# Patient Record
Sex: Female | Born: 1995 | Race: White | Hispanic: No | Marital: Single | Smoking: Never smoker
Health system: Southern US, Community
[De-identification: ages and names within clinical notes are randomized; demographics above are authoritative.]

## PROBLEM LIST (undated history)

## (undated) DIAGNOSIS — R1013 Epigastric pain: Secondary | ICD-10-CM

## (undated) DIAGNOSIS — IMO0002 Reserved for concepts with insufficient information to code with codable children: Secondary | ICD-10-CM

## (undated) DIAGNOSIS — R011 Cardiac murmur, unspecified: Secondary | ICD-10-CM

## (undated) HISTORY — DX: Epigastric pain: R10.13

## (undated) HISTORY — DX: Cardiac murmur, unspecified: R01.1

## (undated) HISTORY — DX: Reserved for concepts with insufficient information to code with codable children: IMO0002

## (undated) HISTORY — PX: TONSILLECTOMY: SUR1361

---
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
## 2013-04-14 ENCOUNTER — Telehealth: Payer: Self-pay | Admitting: Internal Medicine

## 2013-04-14 NOTE — Telephone Encounter (Signed)
Pt aware.

## 2013-04-14 NOTE — Telephone Encounter (Signed)
Pt scheduled for 11:15 on 04/22/13. Please notify pt of this appointment.

## 2013-04-14 NOTE — Telephone Encounter (Signed)
Pt has 2  knots on the back of neck. Pt necks also "pops" when she turns her head, but the biggest thing is the pain in her neck. Pain all the time. Pt having migraines w/ her current BC pills. Pt would prefer to wait for dr Fabian Sharp. Pt home from school for Christmas, ok to  schedule?

## 2013-04-22 ENCOUNTER — Encounter: Payer: Self-pay | Admitting: Internal Medicine

## 2013-04-22 ENCOUNTER — Ambulatory Visit (INDEPENDENT_AMBULATORY_CARE_PROVIDER_SITE_OTHER): Payer: Managed Care, Other (non HMO) | Admitting: Internal Medicine

## 2013-04-22 ENCOUNTER — Ambulatory Visit (INDEPENDENT_AMBULATORY_CARE_PROVIDER_SITE_OTHER)
Admission: RE | Admit: 2013-04-22 | Discharge: 2013-04-22 | Disposition: A | Discharge status: Home / Self Care | Payer: Managed Care, Other (non HMO) | Source: Ambulatory Visit | Attending: Internal Medicine | Admitting: Internal Medicine

## 2013-04-22 VITALS — BP 102/70 | HR 63 | Temp 98.2°F | Wt 128.0 lb

## 2013-04-22 DIAGNOSIS — M62838 Other muscle spasm: Secondary | ICD-10-CM

## 2013-04-22 DIAGNOSIS — Z79899 Other long term (current) drug therapy: Secondary | ICD-10-CM

## 2013-04-22 DIAGNOSIS — Z23 Encounter for immunization: Secondary | ICD-10-CM

## 2013-04-22 DIAGNOSIS — Z3041 Encounter for surveillance of contraceptive pills: Secondary | ICD-10-CM

## 2013-04-22 DIAGNOSIS — G43829 Menstrual migraine, not intractable, without status migrainosus: Secondary | ICD-10-CM

## 2013-04-22 DIAGNOSIS — M542 Cervicalgia: Secondary | ICD-10-CM

## 2013-04-22 MED ORDER — NORETHIN ACE-ETH ESTRAD-FE 1-20 MG-MCG PO TABS: 1.0000 | ORAL_TABLET | Freq: Every day | ORAL | Status: DC

## 2013-04-22 NOTE — Progress Notes (Signed)
Chief Complaint  Patient presents with  . Neck Pain    The two do not seem to be related.    . Migraine    HPI: Patient comes in today for SDA for   problem evaluation. Mom here for part of visit .  Fu has and ocps : Migraines: from  OCP getting worse   Last week.  Had vomiting with this Ha during "placebo week"  Bad head pains and nausea and eyes hurt.  Has bee on ocps for 3 years but only this year a problem   Happens every month s.most recently.   No [place] vision changes  Sleep 8 hours .  No skipped meals .  caffiene some  And having hair up.  [employer] psych and spanish.  Check  Know on neck about  2 months.  Notices when has neck popping. No history of trauma neurologic signs weakness numbness loss of consciousness. ROS: See pertinent positives and negatives per HPI. No cp sob depression syncope .   Past Medical History  Diagnosis Date  . Heart murmur   . Abdominal pain, epigastric 12/07/2009    Qualifier: Diagnosis of  By: Abner Greenspan MD, Misty Stanley    . Other injury of other sites of trunk 08/26/2007    Qualifier: Diagnosis of  By: Fabian Sharp MD, Neta Mends     Family History  Problem Relation Age of Onset  . Thyroid disease Mother   . Diabetes Father   . Breast cancer      maternal aunt    History   Social History  . Marital Status: Single    Spouse Name: N/A    Number of Children: N/A  . Years of Education: N/A   Social History Main Topics  . Smoking status: Never Smoker   . Smokeless tobacco: Never Used  . Alcohol Use: No  . Drug Use: No  . Sexual Activity: None   Other Topics Concern  . None   Social History Narrative   No easy bleeding   Active in Sports    high school student parents divorced      [employer]  Criminology  [employer]    Outpatient Encounter Prescriptions as of 04/22/2013  Medication Sig  . mupirocin cream (BACTROBAN) 2 % Apply topically 3 (three) times daily.  . norethindrone-ethinyl estradiol (JUNEL FE,GILDESS FE,LOESTRIN FE) 1-20  MG-MCG tablet Take 1 tablet by mouth daily. Take continuously for 3 packs.  . triamcinolone cream (KENALOG) 0.1 % Apply topically 2 (two) times daily.  . [DISCONTINUED] norethindrone-ethinyl estradiol (JUNEL FE,GILDESS FE,LOESTRIN FE) 1-20 MG-MCG tablet Take 1 tablet by mouth daily.    EXAM:  BP 102/70  Pulse 63  Temp(Src) 98.2 F (36.8 C) (Oral)  Wt 128 lb (58.06 kg)  SpO2 98%  LMP 04/17/2013  Body mass index is 20.82 kg/(m^2).  GENERAL: vitals reviewed and listed above, alert, oriented, appears well hydrated and in no acute distress HEENT: atraumatic, conjunctiva  clear, no obvious abnormalities on inspection of external nose and ears OP : no lesion edema or exudate  NECK:  Tight ? hypertrophy area right paracervical area  No lump or obv adenopathy.  LUNGS: clear to auscultation bilaterally, no wheezes, rales or rhonchi, good air movement CV: HRRR, no clubbing cyanosis or  peripheral edema nl cap refill  Abdomen:  Sof,t normal bowel sounds without hepatosplenomegaly, no guarding rebound or masses no CVA tenderness MS: moves all extremities without noticeable focal  abnormality Neuro grossly intact  Skin: normal capillary refill ,  turgor , color: No acute rashes ,petechiae or bruising PSYCH: pleasant and cooperative, no obvious depression or anxiety  ASSESSMENT AND PLAN:  Discussed the following assessment and plan:  Menstrual migraine - timing and context  try continuous therapy monitor has rov 6 months touch base 3 months  Neck muscle spasm ? - exercises given consider pt recheck if problamtatic - Plan: DG Cervical Spine Complete  Oral contraceptive use  Neck pain on right side - Plan: DG Cervical Spine Complete  Medication management  Need for HPV vaccination - Plan: HPV vaccine quadravalent 3 dose IM Dont  feel a mass in the neck however she has a very tight thickened paracervical area on the right. Exercises consider pt etc  -Patient advised to return or notify health  care team  if symptoms worsen or persist or new concerns arise.  Patient Instructions  Get neck x ray . This could be spasm . consider physical therapy sports medicine .   Have [place] or other recheck when lump is there.   Try continuous therapy for ocps and monitor HAs.  Contact us in 3 monts by my chart about how this is working . ROV preventive visit in 6 months or as needed.      Neta Mends. Panosh M.D.  Pre visit review using our clinic review tool, if applicable. No additional management support is needed unless otherwise documented below in the visit note.

## 2013-04-22 NOTE — Patient Instructions (Signed)
Get neck x ray . This could be spasm . consider physical therapy sports medicine .   Have [place] or other recheck when lump is there.   Try continuous therapy for ocps and monitor HAs.  Contact us in 3 monts by my chart about how this is working . ROV preventive visit in 6 months or as needed.

## 2013-05-04 ENCOUNTER — Encounter: Payer: Self-pay | Admitting: Internal Medicine

## 2013-06-09 ENCOUNTER — Encounter: Payer: Self-pay | Admitting: Internal Medicine

## 2013-06-09 NOTE — Telephone Encounter (Signed)
prob not pregnancy is taking on time and no missed pills.  Can do OTC pregnancy test .  And repeat in 1 week  To be sure.  Contact us if problems continue

## 2013-06-21 ENCOUNTER — Other Ambulatory Visit: Payer: Self-pay | Admitting: Internal Medicine

## 2013-06-23 ENCOUNTER — Telehealth: Payer: Self-pay | Admitting: Internal Medicine

## 2013-06-23 ENCOUNTER — Encounter: Payer: Self-pay | Admitting: Internal Medicine

## 2013-06-23 MED ORDER — NORETHIN ACE-ETH ESTRAD-FE 1-20 MG-MCG PO TABS: 1.0000 | ORAL_TABLET | Freq: Every day | ORAL | Status: DC

## 2013-06-23 NOTE — Telephone Encounter (Signed)
Pt needs re-fill of norethindrone-ethinyl estradiol (JUNEL FE,GILDESS FE,LOESTRIN FE) 1-20 MG-MCG tablet, sent to [facility] on [address]. Pt was advised the RX was expired.

## 2013-06-23 NOTE — Telephone Encounter (Signed)
Pt needs re-fill of norethindrone-ethinyl estradiol (JUNEL FE,GILDESS FE,LOESTRIN FE) 1-20 MG-MCG tablet [facility] to [facility] [address]. Pt was advised the RX was expired.

## 2013-06-23 NOTE — Telephone Encounter (Signed)
Sent to the pharmacy by e-scribe. 

## 2013-07-07 ENCOUNTER — Encounter (HOSPITAL_BASED_OUTPATIENT_CLINIC_OR_DEPARTMENT_OTHER): Payer: Self-pay | Admitting: Emergency Medicine

## 2013-07-07 ENCOUNTER — Emergency Department (HOSPITAL_BASED_OUTPATIENT_CLINIC_OR_DEPARTMENT_OTHER)
Admission: EM | Admit: 2013-07-07 | Discharge: 2013-07-07 | Disposition: A | Discharge status: Home / Self Care | Payer: Managed Care, Other (non HMO) | Attending: Emergency Medicine | Admitting: Emergency Medicine

## 2013-07-07 ENCOUNTER — Emergency Department (HOSPITAL_BASED_OUTPATIENT_CLINIC_OR_DEPARTMENT_OTHER): Payer: Managed Care, Other (non HMO)

## 2013-07-07 DIAGNOSIS — Z792 Long term (current) use of antibiotics: Secondary | ICD-10-CM | POA: Insufficient documentation

## 2013-07-07 DIAGNOSIS — Y929 Unspecified place or not applicable: Secondary | ICD-10-CM | POA: Insufficient documentation

## 2013-07-07 DIAGNOSIS — Y9389 Activity, other specified: Secondary | ICD-10-CM | POA: Insufficient documentation

## 2013-07-07 DIAGNOSIS — S022XXA Fracture of nasal bones, initial encounter for closed fracture: Secondary | ICD-10-CM

## 2013-07-07 DIAGNOSIS — IMO0002 Reserved for concepts with insufficient information to code with codable children: Secondary | ICD-10-CM | POA: Insufficient documentation

## 2013-07-07 DIAGNOSIS — Z8719 Personal history of other diseases of the digestive system: Secondary | ICD-10-CM | POA: Insufficient documentation

## 2013-07-07 DIAGNOSIS — Z79899 Other long term (current) drug therapy: Secondary | ICD-10-CM | POA: Insufficient documentation

## 2013-07-07 DIAGNOSIS — R011 Cardiac murmur, unspecified: Secondary | ICD-10-CM | POA: Insufficient documentation

## 2013-07-07 DIAGNOSIS — W64XXXA Exposure to other animate mechanical forces, initial encounter: Secondary | ICD-10-CM | POA: Insufficient documentation

## 2013-07-07 MED ORDER — HYDROCODONE-ACETAMINOPHEN 5-325 MG PO TABS: 1.0000 | ORAL_TABLET | ORAL | Status: DC | PRN

## 2013-07-07 MED ORDER — HYDROCODONE-ACETAMINOPHEN 5-325 MG PO TABS
1.0000 | ORAL_TABLET | Freq: Once | ORAL | Status: AC
  Administered 2013-07-07: 1 via ORAL
  Filled 2013-07-07: qty 1

## 2013-07-07 NOTE — ED Notes (Signed)
Patient transported to X-ray 

## 2013-07-07 NOTE — ED Provider Notes (Signed)
CSN: [id number]     Arrival date & time 07/07/13  1551 History   First MD Initiated Contact with Patient 07/07/13 1606     Chief Complaint  Patient presents with  . Facial Injury     (Consider location/radiation/quality/duration/timing/severity/associated sxs/prior Treatment) Patient is a 18 y.o. female presenting with facial injury. The history is provided by the patient.  Facial Injury Mechanism of injury:  Direct blow Location:  Nose Time since incident:  3 hours Pain details:    Quality:  Aching and throbbing   Severity:  Moderate   Timing:  Constant   Progression:  Worsening Chronicity:  New Foreign body present:  No foreign bodies Relieved by:  Nothing Ineffective treatments:  Ice pack Associated symptoms: no double vision, no ear pain, no epistaxis, no headaches, no loss of consciousness, no nausea, no neck pain and no vomiting   Joanne Tucker is a 18 y.o. female who presents to the ED with nasal pain. She had bent forward and her dog jumped up at the same time and his head hit her in her nose. She complains of pain and swelling since the injury. She felt a pop at the time of the injury. She applied ice immediately after the injury. Denies LOC, nose bleeding or any other injuries.   Past Medical History  Diagnosis Date  . Heart murmur   . Abdominal pain, epigastric 12/07/2009    Qualifier: Diagnosis of  By: Abner GreenspanBlyth MD, Misty StanleyStacey    . Other injury of other sites of trunk 08/26/2007    Qualifier: Diagnosis of  By: Fabian SharpPanosh MD, Neta MendsWanda K    Past Surgical History  Procedure Laterality Date  . Tonsillectomy     Family History  Problem Relation Age of Onset  . Thyroid disease Mother   . Diabetes Father   . Breast cancer      maternal aunt   History  Substance Use Topics  . Smoking status: Never Smoker   . Smokeless tobacco: Never Used  . Alcohol Use: No   OB History   Grav Para Term Preterm Abortions TAB SAB Ect Mult Living                 Review of Systems   Constitutional: Negative for fever.  HENT: Negative for ear pain, nosebleeds and sore throat.   Eyes: Negative for double vision and visual disturbance.  Respiratory: Negative for shortness of breath.   Cardiovascular: Negative for chest pain.  Gastrointestinal: Negative for nausea, vomiting and abdominal pain.  Musculoskeletal: Negative for neck pain.  Skin: Negative for wound.  Neurological: Negative for loss of consciousness and headaches.  Psychiatric/Behavioral: Negative for confusion.      Allergies  Review of patient's allergies indicates no known allergies.  Home Medications   Current Outpatient Rx  Name  Route  Sig  Dispense  Refill  . mupirocin cream (BACTROBAN) 2 %   Topical   Apply topically 3 (three) times daily.   15 g   0   . norethindrone-ethinyl estradiol (JUNEL FE,GILDESS FE,LOESTRIN FE) 1-20 MG-MCG tablet   Oral   Take 1 tablet by mouth daily. Take continuously for 3 packs.   3 Package   1     PHARMACY:  PLEASE FILL FOR 6 MONTHS   . triamcinolone cream (KENALOG) 0.1 %   Topical   Apply topically 2 (two) times daily.   30 g   0    BP 108/64  Pulse 62  Temp(Src) 98.2 F (36.8  C) (Oral)  Resp 16  Ht 5\' 7"  (1.702 m)  Wt 125 lb (56.7 kg)  BMI 19.57 kg/m2  SpO2 99% Physical Exam  Nursing note and vitals reviewed. Constitutional: She is oriented to person, place, and time. She appears well-developed and well-nourished. No distress.  HENT:  Head: Normocephalic.  Right Ear: Tympanic membrane normal.  Left Ear: Tympanic membrane normal.  Nose: Mucosal edema and sinus tenderness present. No nasal septal hematoma. No epistaxis.  Mouth/Throat: Uvula is midline, oropharynx is clear and moist and mucous membranes are normal.  Eyes: Conjunctivae and EOM are normal.  Neck: Neck supple.  Cardiovascular: Normal rate.   Pulmonary/Chest: Effort normal.  Abdominal: Soft. There is no tenderness.  Musculoskeletal: Normal range of motion.  Neurological:  She is alert and oriented to person, place, and time. No cranial nerve deficit.  Skin: Skin is warm and dry.  Psychiatric: She has a normal mood and affect. Her behavior is normal.   Dg Nasal Bones  07/07/2013   CLINICAL DATA:  Trauma with pain and swelling  EXAM: NASAL BONES - 3+ VIEW  COMPARISON:  None.  FINDINGS: There are minimal fractures of the nasal bones, not apparently displaced. No fluid in the sinuses.  IMPRESSION: Nondisplaced nasal bone fractures.   Electronically Signed   By: Paulina Fusi M.D.   On: 07/07/2013 16:43    ED Course  Procedures   MDM  18 y.o. female with nasal pain and swelling s/p injury. Will treat with pain medication and she will take Advil. She will follow up with ENT for the nasal fracture. She will continue to apply ice. She will return for any problems.  I have reviewed this patient's vital signs, nurses notes, appropriate imaging and discussed findings with the patient and her mother. They voice understanding.    Medication List    TAKE these medications       HYDROcodone-acetaminophen 5-325 MG per tablet  Commonly known as:  NORCO/VICODIN  Take 1 tablet by mouth every 4 (four) hours as needed.      ASK your doctor about these medications       mupirocin cream 2 %  Commonly known as:  BACTROBAN  Apply topically 3 (three) times daily.     norethindrone-ethinyl estradiol 1-20 MG-MCG tablet  Commonly known as:  JUNEL FE,GILDESS FE,LOESTRIN FE  Take 1 tablet by mouth daily. Take continuously for 3 packs.     triamcinolone cream 0.1 %  Commonly known as:  KENALOG  Apply topically 2 (two) times daily.            [place], [place] 07/07/13 [phone]

## 2013-07-07 NOTE — ED Notes (Signed)
Pt reports that her dog jumped up and hit her in the nose.  Denies bleed.  Noted to have a swollen, slight reddened nose.

## 2013-07-07 NOTE — Discharge Instructions (Signed)
Your x-ray today shows that you have a fracture of the nasal bone. You will need to follow up with ENT. Call Dr. Raye SorrowWolicki's office to schedule an appointment. You can use Afrin Nasal Spray to help decrease the swelling but only use twice a day for 3 days. Continue to apply ice to the area. Take ibuprofen in addition to the narcotic pain medication. Do not take the narcotic if you are driving as it will make you sleepy. Return for any problems.

## 2013-07-07 NOTE — ED Provider Notes (Signed)
Medical screening examination/treatment/procedure(s) were performed by non-physician practitioner and as supervising physician I was immediately available for consultation/collaboration.   EKG Interpretation None        Layla MawKristen N Ward, DO 07/07/13 2301

## 2013-09-16 ENCOUNTER — Encounter: Payer: Self-pay | Admitting: Internal Medicine

## 2013-09-16 ENCOUNTER — Ambulatory Visit (INDEPENDENT_AMBULATORY_CARE_PROVIDER_SITE_OTHER): Payer: Managed Care, Other (non HMO) | Admitting: Internal Medicine

## 2013-09-16 VITALS — BP 110/76 | Temp 98.5°F | Ht 65.75 in | Wt 131.0 lb

## 2013-09-16 DIAGNOSIS — Z299 Encounter for prophylactic measures, unspecified: Secondary | ICD-10-CM

## 2013-09-16 DIAGNOSIS — Z79899 Other long term (current) drug therapy: Secondary | ICD-10-CM

## 2013-09-16 DIAGNOSIS — G43829 Menstrual migraine, not intractable, without status migrainosus: Secondary | ICD-10-CM

## 2013-09-16 DIAGNOSIS — E049 Nontoxic goiter, unspecified: Secondary | ICD-10-CM

## 2013-09-16 DIAGNOSIS — Z23 Encounter for immunization: Secondary | ICD-10-CM

## 2013-09-16 LAB — HEPATIC FUNCTION PANEL
ALBUMIN: 4.2 g/dL (ref 3.5–5.2)
ALT: 25 U/L (ref 0–35)
AST: 20 U/L (ref 0–37)
Alkaline Phosphatase: 56 U/L (ref 47–119)
Bilirubin, Direct: 0.1 mg/dL (ref 0.0–0.3)
Total Bilirubin: 1 mg/dL (ref 0.2–1.2)
Total Protein: 7 g/dL (ref 6.0–8.3)

## 2013-09-16 LAB — BASIC METABOLIC PANEL
BUN: 10 mg/dL (ref 6–23)
CHLORIDE: 105 meq/L (ref 96–112)
CO2: 27 mEq/L (ref 19–32)
Calcium: 9.6 mg/dL (ref 8.4–10.5)
Creatinine, Ser: 0.8 mg/dL (ref 0.4–1.2)
GFR: 97.56 mL/min (ref >60.00)
GLUCOSE: 73 mg/dL (ref 70–99)
POTASSIUM: 5.2 meq/L — AB (ref 3.5–5.1)
Sodium: 138 mEq/L (ref 135–145)

## 2013-09-16 LAB — CBC WITH DIFFERENTIAL/PLATELET
Basophils Absolute: 0 10*3/uL (ref 0.0–0.1)
Basophils Relative: 0.4 % (ref 0.0–3.0)
EOS PCT: 0.5 % (ref 0.0–5.0)
Eosinophils Absolute: 0 10*3/uL (ref 0.0–0.7)
HEMATOCRIT: 37.1 % (ref 36.0–49.0)
Hemoglobin: 12.5 g/dL (ref 12.0–16.0)
LYMPHS ABS: 2.3 10*3/uL (ref 0.7–4.0)
Lymphocytes Relative: 26.6 % (ref 24.0–48.0)
MCHC: 33.6 g/dL (ref 31.0–37.0)
MCV: 83.4 fl (ref 78.0–98.0)
Monocytes Absolute: 0.5 10*3/uL (ref 0.1–1.0)
Monocytes Relative: 6.1 % (ref 3.0–12.0)
Neutro Abs: 5.7 10*3/uL (ref 1.4–7.7)
Neutrophils Relative %: 66.4 % (ref 43.0–71.0)
Platelets: 320 10*3/uL (ref 150.0–575.0)
RBC: 4.45 Mil/uL (ref 3.80–5.70)
RDW: 12.7 % (ref 11.4–15.5)
WBC: 8.6 10*3/uL (ref 4.5–13.5)

## 2013-09-16 LAB — LIPID PANEL
CHOLESTEROL: 143 mg/dL (ref 0–200)
HDL: 51.7 mg/dL (ref >39.00)
LDL Cholesterol: 77 mg/dL (ref 0–99)
Total CHOL/HDL Ratio: 3
Triglycerides: 72 mg/dL (ref 0.0–149.0)
VLDL: 14.4 mg/dL (ref 0.0–40.0)

## 2013-09-16 LAB — TSH: TSH: 1.09 u[IU]/mL (ref 0.40–5.00)

## 2013-09-16 LAB — T4, FREE: Free T4: 0.86 ng/dL (ref 0.60–1.60)

## 2013-09-16 MED ORDER — SUMATRIPTAN SUCCINATE 100 MG PO TABS: ORAL_TABLET | ORAL | Status: DC

## 2013-09-16 NOTE — Progress Notes (Signed)
Chief Complaint  Patient presents with  . Menstrual Migraines    Continues to have migraines during her menstrual cycle.  Would like to have her thyroid checked while she is here.  Would like to discuss HPV injection.    HPI: Comes in today with mom for followup of hormonal therapy for menstrual migraines.  Since being on continuous therapy her migraines haven't gotten any better and she gets monthly diarrhea GI symptoms. Believe it is from the continuous therapy would like to go back to cyclical. Prefers to just have a migraine once a month. Is trying to avoid caffeine.  Continuous hormone  Causing  Diarrhea  During period time no bleeding .  And some moodiness  Worse     Only pre menstrually then .  Migraine advil   For  Headaches.   Describes the headaches as episodic throbbing and can go through all had mild photophobia and phonophobia but no acute vomiting or visual changes.  Mom would like her thyroid checked because it runs with everyone in the family.  Has questions best HPV vaccine had google this and some reports of death related to the vaccine ROS: See pertinent positives and negatives per HPI.no cp sob   Past Medical History  Diagnosis Date  . Heart murmur   . Abdominal pain, epigastric 12/07/2009    Qualifier: Diagnosis of  By: Abner GreenspanBlyth MD, Misty StanleyStacey    . Other injury of other sites of trunk 08/26/2007    Qualifier: Diagnosis of  By: Fabian SharpPanosh MD, Neta MendsWanda K     Family History  Problem Relation Age of Onset  . Thyroid disease Mother   . Diabetes Father   . Breast cancer      maternal aunt    History   Social History  . Marital Status: Single    Spouse Name: N/A    Number of Children: N/A  . Years of Education: N/A   Social History Main Topics  . Smoking status: Never Smoker   . Smokeless tobacco: Never Used  . Alcohol Use: No  . Drug Use: No  . Sexual Activity: None   Other Topics Concern  . None   Social History Narrative   No easy bleeding   Active in  Sports    high school student parents divorced      [employer]  [employer]ology  [employer]    Outpatient Encounter Prescriptions as of 09/16/2013  Medication Sig  . norethindrone-ethinyl estradiol (JUNEL FE,GILDESS FE,LOESTRIN FE) 1-20 MG-MCG tablet Take 1 tablet by mouth daily. Take continuously for 3 packs.  . SUMAtriptan (IMITREX) 100 MG tablet 1 po prn migraine May repeat in 2 hours if headache persists or recurs.  . [DISCONTINUED] HYDROcodone-acetaminophen (NORCO/VICODIN) 5-325 MG per tablet Take 1 tablet by mouth every 4 (four) hours as needed.  . [DISCONTINUED] mupirocin cream (BACTROBAN) 2 % Apply topically 3 (three) times daily.  . [DISCONTINUED] triamcinolone cream (KENALOG) 0.1 % Apply topically 2 (two) times daily.    EXAM:  BP 110/76  Temp(Src) 98.5 F (36.9 C) (Oral)  Ht 5' 5.75" (1.67 m)  Wt 131 lb (59.421 kg)  BMI 21.31 kg/m2  Body mass index is 21.31 kg/(m^2).  GENERAL: vitals reviewed and listed above, alert, oriented, appears well hydrated and in no acute distress HEENT: atraumatic, conjunctiva  clear, no obvious abnormalities on inspection of external nose and ears OP : no lesion edema or exudate  NECK: no obvious masses on inspection thyroid easily palpable on exam but no  nodules tenderness and no adenopathy palpation  LUNGS: clear to auscultation bilaterally, no wheezes, rales or rhonchi, good air movement CV: HRRR, no clubbing cyanosis or  peripheral edema nl cap refill  MS: moves all extremities without noticeable focal  Abnormality Neurologic nonfocal grossly intact PSYCH: pleasant and cooperative, no obvious depression or anxiety  ASSESSMENT AND PLAN:  Discussed the following assessment and plan:  Menstrual migraine - Plan: SUMAtriptan (IMITREX) 100 MG tablet, Basic metabolic panel, CBC with Differential, Hepatic function panel, Lipid panel, TSH, T4, free  Goiter, unspecified - Plan: SUMAtriptan (IMITREX) 100 MG tablet, Basic metabolic panel, CBC with  Differential, Hepatic function panel, Lipid panel, TSH, T4, free  Medication management - Plan: SUMAtriptan (IMITREX) 100 MG tablet, Basic metabolic panel, CBC with Differential, Hepatic function panel, Lipid panel, TSH, T4, free  Preventive measure - Plan: SUMAtriptan (IMITREX) 100 MG tablet, Basic metabolic panel, CBC with Differential, Hepatic function panel, Lipid panel, TSH, T4, free  Need for HPV vaccination - Plan: HPV vaccine quadravalent 3 dose IM Side effects of continuous therapy offered other options we'll go back to cyclical low dose OCPs and had a sumatriptan trial if needed risk-benefit discussed and side effects possible. Reassurance about HPV vaccine based on credible evidence I am not aware of any causality of the mid to get serious problem last review of evidence was reassuring as far safety goes. Discussed HPV 9 but we do not have it in stock at Labs done today will notify of results followup when due for checkup or 4-6 months. -Patient advised to return or notify health care team  if symptoms worsen ,persist or new concerns arise.  Patient Instructions  Trial of Imitrex as needed for headaches  Take 1  Can repeat in 2 hours if needed. Ok to take plain ibupro 800 or 2 aleve generic at onset of HA also .  hpv vaccine advised  No major safety concerns recent studies are reassuring. Will notify you  of labs when available. Ok to change back to cyclical OCPS.     Neta MendsWanda K. Panosh M.D.  Pre visit review using our clinic review tool, if applicable. No additional management support is needed unless otherwise documented below in the visit note.

## 2013-09-16 NOTE — Patient Instructions (Addendum)
Trial of Imitrex as needed for headaches  Take 1  Can repeat in 2 hours if needed. Ok to take plain ibupro 800 or 2 aleve generic at onset of HA also .  hpv vaccine advised  No major safety concerns recent studies are reassuring. Will notify you  of labs when available. Ok to change back to cyclical OCPS.

## 2014-01-28 ENCOUNTER — Telehealth: Payer: Self-pay | Admitting: Internal Medicine

## 2014-01-28 DIAGNOSIS — E049 Nontoxic goiter, unspecified: Secondary | ICD-10-CM

## 2014-01-28 DIAGNOSIS — Z79899 Other long term (current) drug therapy: Secondary | ICD-10-CM

## 2014-01-28 DIAGNOSIS — Z299 Encounter for prophylactic measures, unspecified: Secondary | ICD-10-CM

## 2014-01-28 MED ORDER — SUMATRIPTAN SUCCINATE 100 MG PO TABS: ORAL_TABLET | ORAL | Status: DC

## 2014-01-28 NOTE — Telephone Encounter (Signed)
We can refill her imitrex  But if  Not helping  she should seek care at the health center.Marland Kitchen..Marland Kitchen

## 2014-01-28 NOTE — Telephone Encounter (Signed)
Sent to the pharmacy by e-scribe.  Patient's mom notified that this has been sent in.

## 2014-01-28 NOTE — Telephone Encounter (Signed)
Pt's mother calling on behalf of pt who [place] in [place] who reports migraines are getting worse.  Pt currently has a migraine.  This is an issue she has seen Dr. Fabian SharpPanosh for.  She will be home in November and has an appt scheduled to see Dr. Fabian SharpPanosh on 11/25 to follow up on migraines.  However, mom wants to know if rx can be called in now to help pt as she has mid terms this week.  If so, please send to [facility] [address], [place]

## 2014-03-25 ENCOUNTER — Encounter: Payer: Self-pay | Admitting: Internal Medicine

## 2014-03-25 ENCOUNTER — Ambulatory Visit (INDEPENDENT_AMBULATORY_CARE_PROVIDER_SITE_OTHER): Payer: Managed Care, Other (non HMO) | Admitting: Internal Medicine

## 2014-03-25 VITALS — BP 106/62 | Temp 98.1°F | Ht 65.75 in | Wt 131.9 lb

## 2014-03-25 DIAGNOSIS — N943 Premenstrual tension syndrome: Secondary | ICD-10-CM

## 2014-03-25 DIAGNOSIS — G43829 Menstrual migraine, not intractable, without status migrainosus: Secondary | ICD-10-CM

## 2014-03-25 DIAGNOSIS — Z2821 Immunization not carried out because of patient refusal: Secondary | ICD-10-CM

## 2014-03-25 DIAGNOSIS — K529 Noninfective gastroenteritis and colitis, unspecified: Secondary | ICD-10-CM

## 2014-03-25 DIAGNOSIS — N946 Dysmenorrhea, unspecified: Secondary | ICD-10-CM

## 2014-03-25 MED ORDER — ZALEPLON 5 MG PO CAPS: 5.0000 mg | ORAL_CAPSULE | Freq: Every evening | ORAL | Status: DC | PRN

## 2014-03-25 NOTE — Progress Notes (Signed)
Pre visit review using our clinic review tool, if applicable. No additional management support is needed unless otherwise documented below in the visit note.  Chief Complaint  Patient presents with  . Follow-up    HPI: Joanne Tucker 18 y.o.  Here with mom today  And noted HAs continue and imitrex caused se sweating and feeling bad and didn't help HA . Tends to still occur on the 3rd inert pill day ( 24?)  ROS: See pertinent positives and negatives per HPI. Usually mon through Thursday could have a ha  And now 1-2 days and worse  Pills have helped the cramps and periods and now SA. Has had pp diarrhea described as watery  urgentfor weeks only with dinner  No blood and no fever other travel infection  Past Medical History  Diagnosis Date  . Heart murmur   . Abdominal pain, epigastric 12/07/2009    Qualifier: Diagnosis of  By: Abner GreenspanBlyth MD, Misty StanleyStacey    . Other injury of other sites of trunk 08/26/2007    Qualifier: Diagnosis of  By: Fabian SharpPanosh MD, Neta MendsWanda K     Family History  Problem Relation Age of Onset  . Thyroid disease Mother   . Diabetes Father   . Breast cancer      maternal aunt    History   Social History  . Marital Status: Single    Spouse Name: N/A    Number of Children: N/A  . Years of Education: N/A   Social History Main Topics  . Smoking status: Never Smoker   . Smokeless tobacco: Never Used  . Alcohol Use: No  . Drug Use: No  . Sexual Activity: Not on file   Other Topics Concern  . Not on file   Social History Narrative   No easy bleeding   Active in Sports    high school student parents divorced      [employer]ology  [employer]ising freshman    Outpatient Encounter Prescriptions as of 03/25/2014  Medication Sig  . norethindrone-ethinyl estradiol (JUNEL FE,GILDESS FE,LOESTRIN FE) 1-20 MG-MCG tablet Take 1 tablet by mouth daily. Take continuously for 3 packs.  . [DISCONTINUED] SUMAtriptan (IMITREX) 100 MG tablet 1 po prn migraine May repeat in 2 hours if  headache persists or recurs.  . [DISCONTINUED] zaleplon (SONATA) 5 MG capsule Take 1 capsule (5 mg total) by mouth at bedtime as needed for sleep (travel).    EXAM:  BP 106/62 mmHg  Temp(Src) 98.1 F (36.7 C) (Oral)  Ht 5' 5.75" (1.67 m)  Wt 131 lb 14.4 oz (59.829 kg)  BMI 21.45 kg/m2  Body mass index is 21.45 kg/(m^2).  GENERAL: vitals reviewed and listed above, alert, oriented, appears well hydrated and in no acute distress HEENT: atraumatic, conjunctiva  clear, no obvious abnormalities on inspection of external nose and ears  Neck nodules PSYCH: pleasant and cooperative, no obvious depression or anxiety slightly stressed  And frustrated  Lab Results  Component Value Date   WBC 8.6 09/16/2013   HGB 12.5 09/16/2013   HCT 37.1 09/16/2013   PLT 320.0 09/16/2013   GLUCOSE 73 09/16/2013   CHOL 143 09/16/2013   TRIG 72.0 09/16/2013   HDL 51.70 09/16/2013   LDLCALC 77 09/16/2013   ALT 25 09/16/2013   AST 20 09/16/2013   NA 138 09/16/2013   K 5.2* 09/16/2013   CL 105 09/16/2013   CREATININE 0.8 09/16/2013   BUN 10 09/16/2013   CO2 27 09/16/2013   TSH 1.09 09/16/2013  ASSESSMENT AND PLAN:  Discussed the following assessment and plan:  Dysmenorrhea - Plan: Ambulatory referral to Gynecology  Menstrual migraine without status migrainosus, not intractable - triggered by ocps but ocps control period cramps stomach and bleeding   contuous ocps made worse.  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 Continuous therapy was described as" worse for her ha: "stomach"   Advise GYNE consult eval .  ? If mirena would be helpful . In the interim  4440 - 550 of naprosyn pre headache .  Try pro biotics for  Pp late day diarrhea . Consider further wu if  persistent or progressive    Monitoring yearly thyroid functions  Patient  Instructions  Will plan on  Referral to gyne to considier other options  for periods and to help avoid   menstrual migraines Headaches . That may be triggered by the OCPS . ? If mirena iud could help or other.  Try probiotics at this time   To see if help.   / florastat culturelle  Other  Try 2.5 aleve( 440 - 550 mg naproxyn   When headache coming .   And can repeat every 8-12 hours  If diarrhea doesn't to  get better   With diet manipulations ...    Wellness visit in 6 months or as needed           Neta MendsWanda K. Panosh M.D.

## 2014-03-25 NOTE — Patient Instructions (Signed)
Will plan on  Referral to gyne to considier other options  for periods and to help avoid   menstrual migraines Headaches . That may be triggered by the OCPS . ? If mirena iud could help or other.  Try probiotics at this time   To see if help.   / florastat culturelle  Other  Try 2.5 aleve( 440 - 550 mg naproxyn   When headache coming .   And can repeat every 8-12 hours  If diarrhea doesn't to  get better   With diet manipulations ...    Wellness visit in 6 months or as needed

## 2014-05-12 ENCOUNTER — Other Ambulatory Visit: Payer: Self-pay | Admitting: Internal Medicine

## 2014-05-13 ENCOUNTER — Other Ambulatory Visit: Payer: Self-pay | Admitting: Internal Medicine

## 2014-09-15 ENCOUNTER — Other Ambulatory Visit: Payer: Self-pay | Admitting: Internal Medicine

## 2014-09-17 ENCOUNTER — Encounter: Payer: Self-pay | Admitting: Internal Medicine

## 2014-09-17 ENCOUNTER — Ambulatory Visit (INDEPENDENT_AMBULATORY_CARE_PROVIDER_SITE_OTHER): Payer: Managed Care, Other (non HMO) | Admitting: Internal Medicine

## 2014-09-17 VITALS — BP 104/72 | Temp 98.6°F | Ht 66.25 in | Wt 133.0 lb

## 2014-09-17 DIAGNOSIS — Z789 Other specified health status: Secondary | ICD-10-CM | POA: Diagnosis not present

## 2014-09-17 DIAGNOSIS — Z79899 Other long term (current) drug therapy: Secondary | ICD-10-CM | POA: Diagnosis not present

## 2014-09-17 DIAGNOSIS — G43829 Menstrual migraine, not intractable, without status migrainosus: Secondary | ICD-10-CM

## 2014-09-17 DIAGNOSIS — N946 Dysmenorrhea, unspecified: Secondary | ICD-10-CM | POA: Diagnosis not present

## 2014-09-17 DIAGNOSIS — N943 Premenstrual tension syndrome: Secondary | ICD-10-CM

## 2014-09-17 NOTE — Patient Instructions (Signed)
I agree with continuous  Hormonal therapy .   contact us if need  4 months instead of 3 months continuously.  Otherwise   Wellness or ov in a year.

## 2014-09-17 NOTE — Progress Notes (Signed)
Pre visit review using our clinic review tool, if applicable. No additional management support is needed unless otherwise documented below in the visit note.  Chief Complaint  Patient presents with  . Follow-up    med period migraines    HPI: Joanne Tucker 19 y.o.  comes in for chronic disease/ medication management  Control of menstrual migraine peridos cramps  Since last check  Has seen gyne  [place] .   Taking  continuousley  Migraine  Only  4 days withdrawal  And continuous.  Helps   3- 6 months .   Range  Tried gluten free. Diet and also seem to help  Now is  Living in [place].  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 2 broken toes  Trying out hover board no other change in health cp sob syncope depression etc  Neg td   Past Medical History  Diagnosis Date  . Heart murmur   . Abdominal pain, epigastric 12/07/2009    Qualifier: Diagnosis of  By: Joanne Tucker    . Other injury of other sites of trunk 08/26/2007    Qualifier: Diagnosis of  By: Joanne Tucker     Family History  Problem Relation Age of Onset  . Thyroid disease Mother   . Diabetes Father   . Breast cancer      maternal aunt    History   Social History  . Marital Status: Single    Spouse Name: N/A  . Number of Children: N/A  . Years of Education: N/A   Social History Main Topics  . Smoking status: Never Smoker   . Smokeless tobacco: Never Used  . Alcohol Use: No  . Drug Use: No  . Sexual Activity: Not on file   Other Topics Concern  . None   Social History Narrative   No easy bleeding   Active in Sports    high school student parents divorced      Joanne Tucker   [employer] sohphomore    Outpatient Prescriptions Prior to Visit  Medication Sig Dispense Refill  . JUNEL FE 1/20 1-20 MG-MCG tablet TAKE 1 TABLET BY MOUTH EVERY DAY CONTINOUSLY FOR 3 MONTHS 168 tablet 0   No  facility-administered medications prior to visit.     EXAM:  BP 104/72 mmHg  Temp(Src) 98.6 F (37 C) (Oral)  Ht 5' 6.25" (1.683 m)  Wt 133 lb (60.328 kg)  BMI 21.30 kg/m2  Body mass index is 21.3 kg/(m^2).  GENERAL: vitals reviewed and listed above, alert, oriented, appears well hydrated and in no acute distress HEENT: atraumatic, conjunctiva  clear, no obvious abnormalities on inspection of external nose and ears OP : no lesion edema or exudate  NECK: no obvious masses on inspection palpation  LUNGS: clear to auscultation bilaterally, no wheezes, rales or rhonchi, good air movement CV: HRRR, no clubbing cyanosis or  peripheral edema nl cap refill  MS: moves all extremities without noticeable focal  abnormality PSYCH: pleasant and cooperative, no obvious depression or anxiety Lab Results  Component Value Date   WBC 8.6 09/16/2013   HGB 12.5 09/16/2013   HCT 37.1 09/16/2013   PLT 320.0 09/16/2013   GLUCOSE 73 09/16/2013   CHOL 143 09/16/2013   TRIG 72.0 09/16/2013   HDL 51.70 09/16/2013   LDLCALC 77 09/16/2013  ALT 25 09/16/2013   AST 20 09/16/2013   NA 138 09/16/2013   Tucker 5.2* 09/16/2013   CL 105 09/16/2013   CREATININE 0.8 09/16/2013   BUN 10 09/16/2013   CO2 27 09/16/2013   TSH 1.09 09/16/2013    ASSESSMENT AND PLAN:  Discussed the following assessment and plan:  Menstrual migraine without status migrainosus, not intractable - controlled with hormone manipu continueou q3-6 months  and diet changes  Dysmenorrhea  Medication management  Consumes gluten free diet doin a lot better continue   Contact us to contine refill for a year    Preventive check up in a year or as needed  -Patient advised to return or notify health care team  if symptoms worsen ,persist or new concerns arise.  Patient Instructions  I agree with continuous  Hormonal therapy .   contact us if need  4 months instead of 3 months continuously.  Otherwise   Wellness or ov in a year.     Joanne Tucker. Ehsan Tucker M.D.

## 2015-06-03 ENCOUNTER — Telehealth: Payer: Self-pay | Admitting: Internal Medicine

## 2015-06-03 MED ORDER — NORETHIN ACE-ETH ESTRAD-FE 1-20 MG-MCG PO TABS: ORAL_TABLET | ORAL | Status: DC

## 2015-06-03 NOTE — Telephone Encounter (Signed)
Pt made appointment , however, would like to do labs the same day as appoinment for CPE. Do to being at college, only day she could come in was a Monday am. So I worked it out.Thank you Misty!

## 2015-06-03 NOTE — Telephone Encounter (Signed)
Sent to the pharmacy by e-scribe.  She is due for CPX in May 2017.  Please help her to make this appointment.  Has not had lab work in 2 year.  Let me know if she wants labs.  I will then place the orders.

## 2015-06-03 NOTE — Telephone Encounter (Signed)
Pt request refill JUNEL FE 1/20 1-20 MG-MCG tablet  Pt states parm told her it is expired and she only has 2 tabs left. Pt states she came in for office visit 5/19/6 and thought this was supposed to be a year prescription.  Pt is at [facility] states  and needs rx sent to [facility]/ [address]/ [address]  [address] [address]  [phone]

## 2015-07-20 IMAGING — CR DG NASAL BONES 3+V
3 series · 3 of 3 positions shown · non-contrast
Comparison: None.

CLINICAL DATA: Trauma with pain and swelling

EXAM:
NASAL BONES - 3+ VIEW

[w waters *]
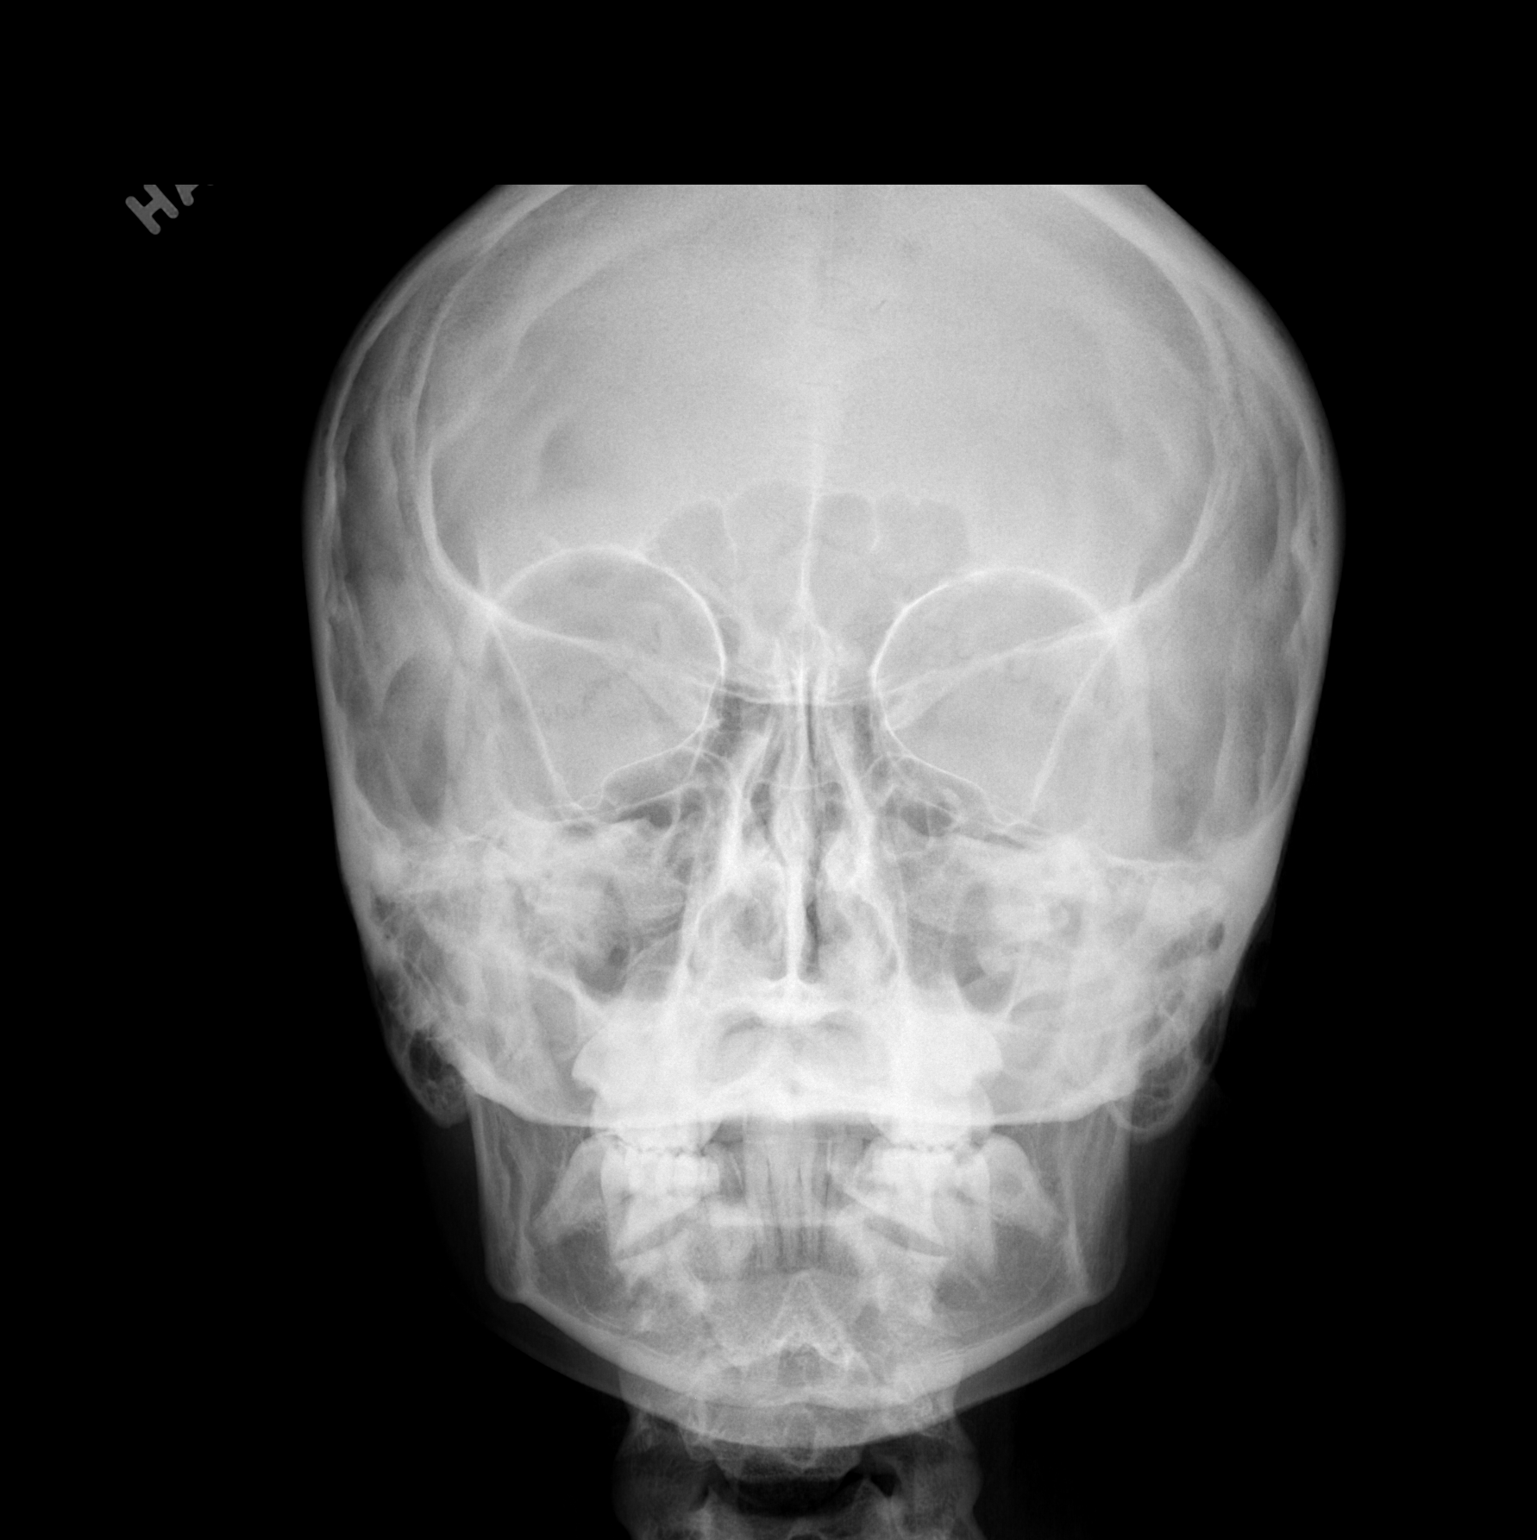

[w nasal bone lat * (1 of 2)]
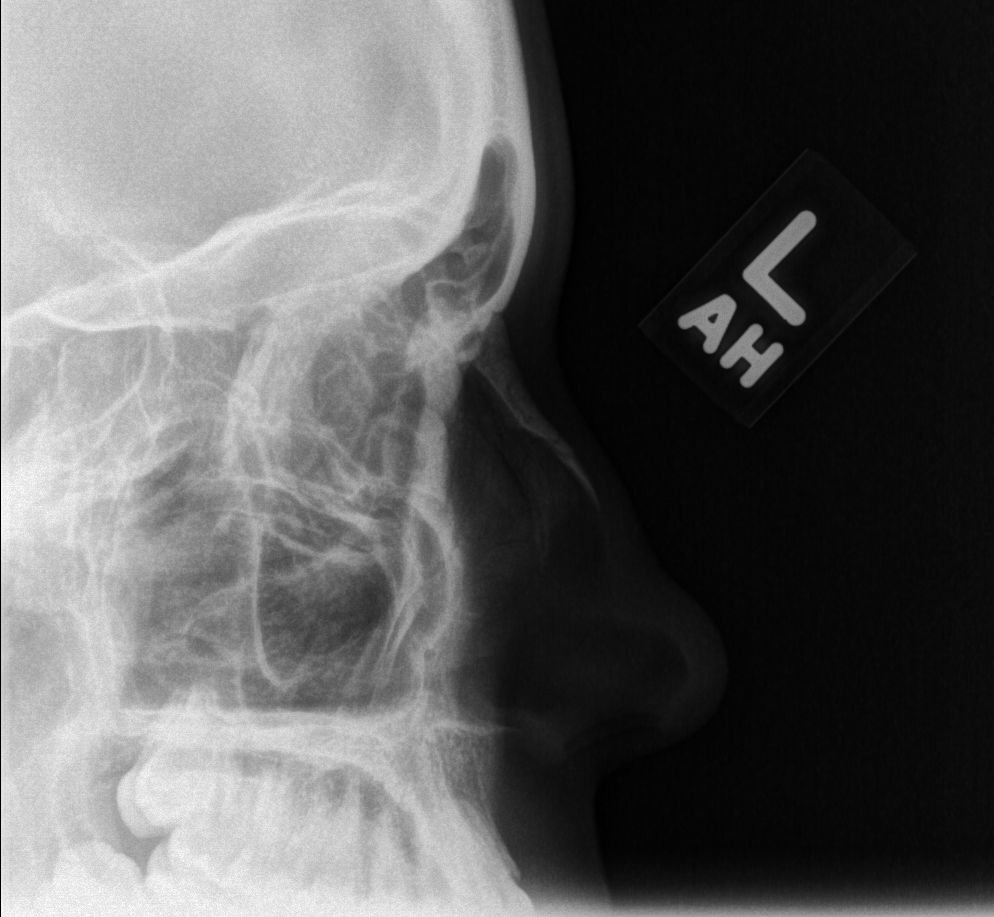

[w nasal bone lat * (2 of 2)]
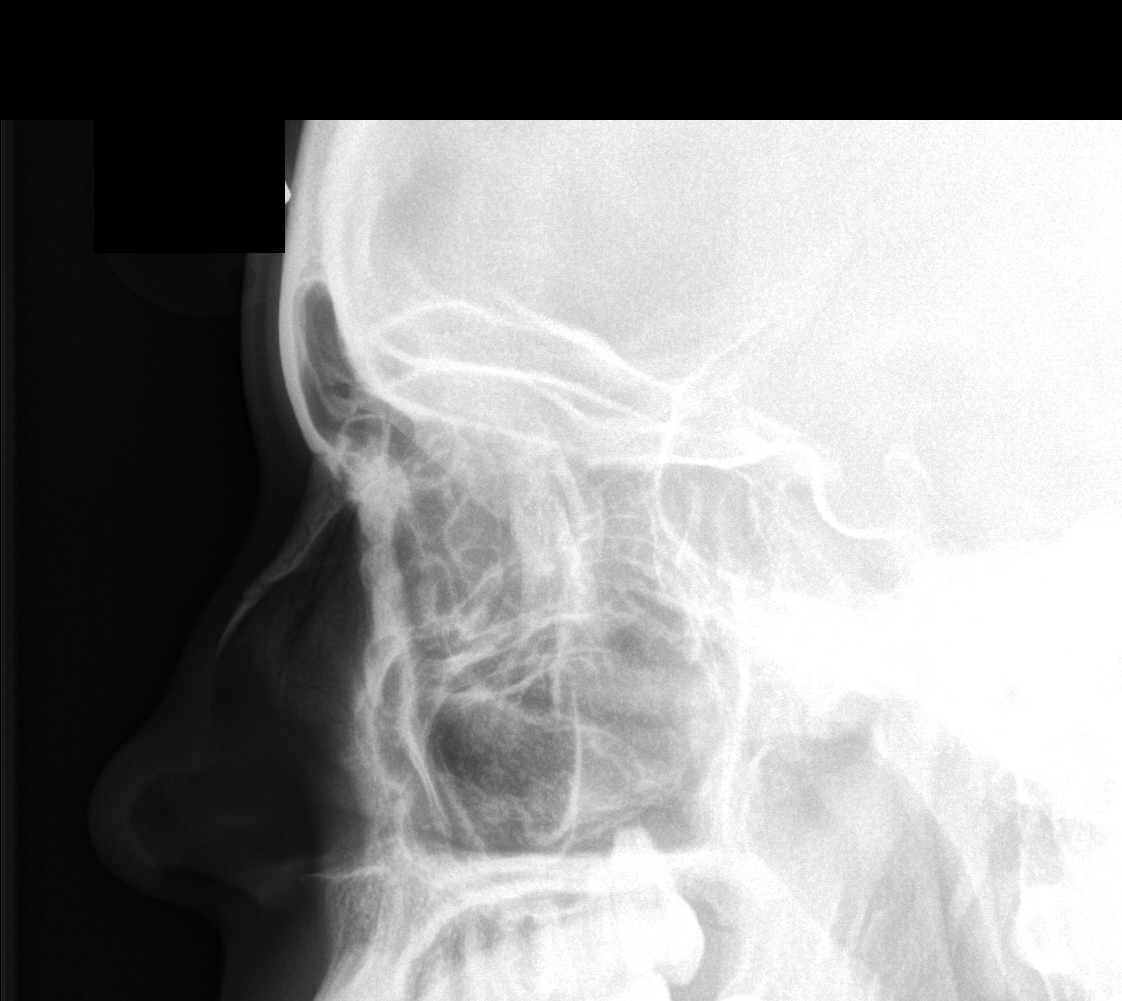

[3 of 3 positions shown; findings below may reference images not displayed]

FINDINGS: There are minimal fractures of the nasal bones, not apparently
displaced. No fluid in the sinuses.
IMPRESSION: Nondisplaced nasal bone fractures.

## 2015-07-29 ENCOUNTER — Other Ambulatory Visit: Payer: Self-pay | Admitting: Family Medicine

## 2015-07-29 MED ORDER — NORETHIN ACE-ETH ESTRAD-FE 1-20 MG-MCG PO TABS: ORAL_TABLET | ORAL | Status: AC

## 2015-07-29 NOTE — Telephone Encounter (Signed)
Sent to the pharmacy.  Pt has upcoming appt on 09/13/15

## 2015-08-07 ENCOUNTER — Other Ambulatory Visit: Payer: Self-pay | Admitting: Internal Medicine

## 2015-08-09 NOTE — Telephone Encounter (Signed)
Filled on 07/29/15.  Duplicate request.

## 2015-09-13 ENCOUNTER — Ambulatory Visit (INDEPENDENT_AMBULATORY_CARE_PROVIDER_SITE_OTHER): Payer: Managed Care, Other (non HMO) | Admitting: Internal Medicine

## 2015-09-13 ENCOUNTER — Encounter: Payer: Self-pay | Admitting: Internal Medicine

## 2015-09-13 VITALS — BP 102/60 | HR 64 | Temp 98.3°F | Ht 66.25 in | Wt 146.0 lb

## 2015-09-13 DIAGNOSIS — Z0000 Encounter for general adult medical examination without abnormal findings: Secondary | ICD-10-CM | POA: Diagnosis not present

## 2015-09-13 DIAGNOSIS — Z3041 Encounter for surveillance of contraceptive pills: Secondary | ICD-10-CM | POA: Diagnosis not present

## 2015-09-13 NOTE — Progress Notes (Signed)
Chief Complaint  Patient presents with  . Annual Exam    HPI: Patient  Joanne Tucker  20 y.o. comes in today for Preventive Health Care visit   Had thyroid liver  And h pylori  Tests done at [facility] health at [facility] .when had a short lived gi upset and doignb etter now . Had gyne check  For ocps and menstrual migraines in April and doing well .  Finished junipor year history and poly sci and bus . No injuries dep anxiety  Go to [employer] and  Work  [employer]   Health Maintenance  Topic Date Due  . HIV Screening  01/27/2009  . TETANUS/TDAP  01/27/2013  . INFLUENZA VACCINE  11/30/2015  . PAP SMEAR  08/06/2018   Health Maintenance Review LIFESTYLE:  Exercise:   5 days weight cardio .  ocass r ankle  Tobacco/ETS:  no Alcohol:  Rare  Sugar beverages: no Sleep: about 7- 8  Drug use: no    ROS:  GEN/ HEENT: No fever, significant weight changes sweats headaches vision problems hearing changes, CV/ PULM; No chest pain shortness of breath cough, syncope,edema  change in exercise tolerance. GI /GU: No adominal pain, vomiting, change in bowel habits. No blood in the stool. No significant GU symptoms. SKIN/HEME: ,no acute skin rashes suspicious lesions or bleeding. No lymphadenopathy, nodules, masses.  NEURO/ PSYCH:  No neurologic signs such as weakness numbness. No depression anxiety. IMM/ Allergy: No unusual infections.  Allergy .   REST of 12 system review negative except as per HPI   Past Medical History  Diagnosis Date  . Heart murmur   . Abdominal pain, epigastric 12/07/2009    Qualifier: Diagnosis of  By: Charlett Blake MD, Erline Levine    . Other injury of other sites of trunk 08/26/2007    Qualifier: Diagnosis of  By: Regis Bill MD, Standley Brooking     Past Surgical History  Procedure Laterality Date  . Tonsillectomy      Family History  Problem Relation Age of Onset  . Thyroid disease Mother   . Diabetes Father   . Breast cancer      maternal aunt    Social History   Social  History  . Marital Status: Single    Spouse Name: N/A  . Number of Children: N/A  . Years of Education: N/A   Social History Main Topics  . Smoking status: Never Smoker   . Smokeless tobacco: Never Used  . Alcohol Use: No  . Drug Use: No  . Sexual Activity: Not Asked   Other Topics Concern  . None   Social History Narrative   No easy bleeding   parents divorced      [employer]   [employer] business lives off campus    Outpatient Prescriptions Prior to Visit  Medication Sig Dispense Refill  . norethindrone-ethinyl estradiol (JUNEL FE 1/20) 1-20 MG-MCG tablet TAKE 1 TABLET BY MOUTH EVERY DAY CONTINOUSLY FOR 3 MONTHS 84 tablet 0   No facility-administered medications prior to visit.     EXAM:  BP 102/60 mmHg  Pulse 64  Temp(Src) 98.3 F (36.8 C) (Oral)  Ht 5' 6.25" (1.683 m)  Wt 146 lb (66.225 kg)  BMI 23.38 kg/m2  SpO2 98%  LMP 03/14/2015 (Exact Date)  Body mass index is 23.38 kg/(m^2).  Physical Exam: Vital signs reviewed OXB:DZHG is a well-developed well-nourished alert cooperative    who appearsr stated age in no acute distress.  HEENT: normocephalic atraumatic ,  Eyes: PERRL EOM's full, conjunctiva clear, Nares: paten,t no deformity discharge or tenderness., Ears: no deformity EAC's clear TMs with normal landmarks. Mouth: clear OP, no lesions, edema.  Moist mucous membranes. Dentition in adequate repair. NECK: supple without masses, thyromegaly or bruits. CHEST/PULM:  Clear to auscultation and percussion breath sounds equal no wheeze , rales or rhonchi. No chest wall deformities or tenderness. CV: PMI is nondisplaced, S1 S2 no gallops, murmurs, rubs. Peripheral pulses are full without delay.No JVD . Breast: normal by inspection . No dimpling, discharge, masses, tenderness or discharge . ABDOMEN: Bowel sounds normal nontender  No guard or rebound, no hepato splenomegal no CVA tenderness.  No hernia. Extremtities:  No clubbing cyanosis or edema, no acute joint swelling  or redness no focal atrophy NEURO:  Oriented x3, cranial nerves 3-12 appear to be intact, no obvious focal weakness,gait within normal limits no abnormal reflexes or asymmetrical SKIN: No acute rashes normal turgor, color, no bruising or petechiae. PSYCH: Oriented, good eye contact, no obvious depression anxiety, cognition and judgment appear normal. LN: no cervical axillary inguinal adenopathy  ASSESSMENT AND PLAN:  Discussed the following assessment and plan:  Visit for preventive health examination  Oral contraceptive use - menstrual migraine  dysmenorrhea Doing well reviewed healthy habit  No need for labs today as done elsewhere    Counseled regarding healthy nutrition, exercise, sleep, injury prevention, calcium vit d and healthy weight .she is well.   Patient Care Team: Burnis Medin, MD as PCP - General Jerelyn Charles, MD as Consulting Physician (Obstetrics) Patient Instructions  Get [place]  Copy of the most recent labs as discussed . Your cholesterol was excellent at last check  Continue lifestyle i healthy eating and exercise .  Due for tdap booster nexct year  2018.    Health Maintenance, Female Adopting a healthy lifestyle and getting preventive care can go a long way to promote health and wellness. Talk with your health care provider about what schedule of regular examinations is right for you. This is a good chance for you to check in with your provider about disease prevention and staying healthy. In between checkups, there are plenty of things you can do on your own. Experts have done a lot of research about which lifestyle changes and preventive measures are most likely to keep you healthy. Ask your health care provider for more information. WEIGHT AND DIET  Eat a healthy diet  Be sure to include plenty of vegetables, fruits, low-fat dairy products, and lean protein.  Do not eat a lot of foods high in solid fats, added sugars, or salt.  Get regular exercise. This is  one of the most important things you can do for your health.  Most adults should exercise for at least 150 minutes each week. The exercise should increase your heart rate and make you sweat (moderate-intensity exercise).  Most adults should also do strengthening exercises at least twice a week. This is in addition to the moderate-intensity exercise.  Maintain a healthy weight  Body mass index (BMI) is a measurement that can be used to identify possible weight problems. It estimates body fat based on height and weight. Your health care provider can help determine your BMI and help you achieve or maintain a healthy weight.  For females 38 years of age and older:   A BMI below 18.5 is considered underweight.  A BMI of 18.5 to 24.9 is normal.  A BMI of 25 to 29.9 is considered overweight.  A  BMI of 30 and above is considered obese.  Watch levels of cholesterol and blood lipids  You should start having your blood tested for lipids and cholesterol at 22 years of age, then have this test every 5 years.  You may need to have your cholesterol levels checked more often if:  Your lipid or cholesterol levels are high.  You are older than 22 years of age.  You are at high risk for heart disease.  CANCER SCREENING   Lung Cancer  Lung cancer screening is recommended for adults 50-46 years old who are at high risk for lung cancer because of a history of smoking.  A yearly low-dose CT scan of the lungs is recommended for people who:  Currently smoke.  Have quit within the past 15 years.  Have at least a 30-pack-year history of smoking. A pack year is smoking an average of one pack of cigarettes a day for 1 year.  Yearly screening should continue until it has been 15 years since you quit.  Yearly screening should stop if you develop a health problem that would prevent you from having lung cancer treatment.  Breast Cancer  Practice breast self-awareness. This means understanding  how your breasts normally appear and feel.  It also means doing regular breast self-exams. Let your health care provider know about any changes, no matter how small.  If you are in your 20s or 30s, you should have a clinical breast exam (CBE) by a health care provider every 1-3 years as part of a regular health exam.  If you are 57 or older, have a CBE every year. Also consider having a breast X-ray (mammogram) every year.  If you have a family history of breast cancer, talk to your health care provider about genetic screening.  If you are at high risk for breast cancer, talk to your health care provider about having an MRI and a mammogram every year.  Breast cancer gene (BRCA) assessment is recommended for women who have family members with BRCA-related cancers. BRCA-related cancers include:  Breast.  Ovarian.  Tubal.  Peritoneal cancers.  Results of the assessment will determine the need for genetic counseling and BRCA1 and BRCA2 testing. Cervical Cancer Your health care provider may recommend that you be screened regularly for cancer of the pelvic organs (ovaries, uterus, and vagina). This screening involves a pelvic examination, including checking for microscopic changes to the surface of your cervix (Pap test). You may be encouraged to have this screening done every 3 years, beginning at age 68.  For women ages 82-65, health care providers may recommend pelvic exams and Pap testing every 3 years, or they may recommend the Pap and pelvic exam, combined with testing for human papilloma virus (HPV), every 5 years. Some types of HPV increase your risk of cervical cancer. Testing for HPV may also be done on women of any age with unclear Pap test results.  Other health care providers may not recommend any screening for nonpregnant women who are considered low risk for pelvic cancer and who do not have symptoms. Ask your health care provider if a screening pelvic exam is right for  you.  If you have had past treatment for cervical cancer or a condition that could lead to cancer, you need Pap tests and screening for cancer for at least 20 years after your treatment. If Pap tests have been discontinued, your risk factors (such as having a new sexual partner) need to be reassessed to determine if  screening should resume. Some women have medical problems that increase the chance of getting cervical cancer. In these cases, your health care provider may recommend more frequent screening and Pap tests. Colorectal Cancer  This type of cancer can be detected and often prevented.  Routine colorectal cancer screening usually begins at 22 years of age and continues through 22 years of age.  Your health care provider may recommend screening at an earlier age if you have risk factors for colon cancer.  Your health care provider may also recommend using home test kits to check for hidden blood in the stool.  A small camera at the end of a tube can be used to examine your colon directly (sigmoidoscopy or colonoscopy). This is done to check for the earliest forms of colorectal cancer.  Routine screening usually begins at age 58.  Direct examination of the colon should be repeated every 5-10 years through 22 years of age. However, you may need to be screened more often if early forms of precancerous polyps or small growths are found. Skin Cancer  Check your skin from head to toe regularly.  Tell your health care provider about any new moles or changes in moles, especially if there is a change in a mole's shape or color.  Also tell your health care provider if you have a mole that is larger than the size of a pencil eraser.  Always use sunscreen. Apply sunscreen liberally and repeatedly throughout the day.  Protect yourself by wearing long sleeves, pants, a wide-brimmed hat, and sunglasses whenever you are outside. HEART DISEASE, DIABETES, AND HIGH BLOOD PRESSURE   High blood  pressure causes heart disease and increases the risk of stroke. High blood pressure is more likely to develop in:  People who have blood pressure in the high end of the normal range (130-139/85-89 mm Hg).  People who are overweight or obese.  People who are African American.  If you are 1-59 years of age, have your blood pressure checked every 3-5 years. If you are 62 years of age or older, have your blood pressure checked every year. You should have your blood pressure measured twice--once when you are at a hospital or clinic, and once when you are not at a hospital or clinic. Record the average of the two measurements. To check your blood pressure when you are not at a hospital or clinic, you can use:  An automated blood pressure machine at a pharmacy.  A home blood pressure monitor.  If you are between 4 years and 25 years old, ask your health care provider if you should take aspirin to prevent strokes.  Have regular diabetes screenings. This involves taking a blood sample to check your fasting blood sugar level.  If you are at a normal weight and have a low risk for diabetes, have this test once every three years after 22 years of age.  If you are overweight and have a high risk for diabetes, consider being tested at a younger age or more often. PREVENTING INFECTION  Hepatitis B  If you have a higher risk for hepatitis B, you should be screened for this virus. You are considered at high risk for hepatitis B if:  You were born in a country where hepatitis B is common. Ask your health care provider which countries are considered high risk.  Your parents were born in a high-risk country, and you have not been immunized against hepatitis B (hepatitis B vaccine).  You have HIV or  AIDS.  You use needles to inject street drugs.  You live with someone who has hepatitis B.  You have had sex with someone who has hepatitis B.  You get hemodialysis treatment.  You take certain  medicines for conditions, including cancer, organ transplantation, and autoimmune conditions. Hepatitis C  Blood testing is recommended for:  Everyone born from [birth year] through 1967.  Anyone with known risk factors for hepatitis C. Sexually transmitted infections (STIs)  You should be screened for sexually transmitted infections (STIs) including gonorrhea and chlamydia if:  You are sexually active and are younger than 22 years of age.  You are older than 22 years of age and your health care provider tells you that you are at risk for this type of infection.  Your sexual activity has changed since you were last screened and you are at an increased risk for chlamydia or gonorrhea. Ask your health care provider if you are at risk.  If you do not have HIV, but are at risk, it may be recommended that you take a prescription medicine daily to prevent HIV infection. This is called pre-exposure prophylaxis (PrEP). You are considered at risk if:  You are sexually active and do not regularly use condoms or know the HIV status of your partner(s).  You take drugs by injection.  You are sexually active with a partner who has HIV. Talk with your health care provider about whether you are at high risk of being infected with HIV. If you choose to begin PrEP, you should first be tested for HIV. You should then be tested every 3 months for as long as you are taking PrEP.  PREGNANCY   If you are premenopausal and you may become pregnant, ask your health care provider about preconception counseling.  If you may become pregnant, take 400 to 800 micrograms (mcg) of folic acid every day.  If you want to prevent pregnancy, talk to your health care provider about birth control (contraception). OSTEOPOROSIS AND MENOPAUSE   Osteoporosis is a disease in which the bones lose minerals and strength with aging. This can result in serious bone fractures. Your risk for osteoporosis can be identified using a bone  density scan.  If you are 26 years of age or older, or if you are at risk for osteoporosis and fractures, ask your health care provider if you should be screened.  Ask your health care provider whether you should take a calcium or vitamin D supplement to lower your risk for osteoporosis.  Menopause may have certain physical symptoms and risks.  Hormone replacement therapy may reduce some of these symptoms and risks. Talk to your health care provider about whether hormone replacement therapy is right for you.  HOME CARE INSTRUCTIONS   Schedule regular health, dental, and eye exams.  Stay current with your immunizations.   Do not use any tobacco products including cigarettes, chewing tobacco, or electronic cigarettes.  If you are pregnant, do not drink alcohol.  If you are breastfeeding, limit how much and how often you drink alcohol.  Limit alcohol intake to no more than 1 drink per day for nonpregnant women. One drink equals 12 ounces of beer, 5 ounces of wine, or 1 ounces of hard liquor.  Do not use street drugs.  Do not share needles.  Ask your health care provider for help if you need support or information about quitting drugs.  Tell your health care provider if you often feel depressed.  Tell your health care provider  if you have ever been abused or do not feel safe at home.   This information is not intended to replace advice given to you by your health care provider. Make sure you discuss any questions you have with your health care provider.   Document Released: 10/31/2010 Document Revised: 05/08/2014 Document Reviewed: 03/19/2013 Elsevier Interactive Patient Education 2016 McNabb K. Panosh M.D.

## 2015-09-13 NOTE — Patient Instructions (Addendum)
Get us  Copy of the most recent labs as discussed . Your cholesterol was excellent at last check  Continue lifestyle i healthy eating and exercise .  Due for tdap booster nexct year  2018.    Health Maintenance, Female Adopting a healthy lifestyle and getting preventive care can go a long way to promote health and wellness. Talk with your health care provider about what schedule of regular examinations is right for you. This is a good chance for you to check in with your provider about disease prevention and staying healthy. In between checkups, there are plenty of things you can do on your own. Experts have done a lot of research about which lifestyle changes and preventive measures are most likely to keep you healthy. Ask your health care provider for more information. WEIGHT AND DIET  Eat a healthy diet  Be sure to include plenty of vegetables, fruits, low-fat dairy products, and lean protein.  Do not eat a lot of foods high in solid fats, added sugars, or salt.  Get regular exercise. This is one of the most important things you can do for your health.  Most adults should exercise for at least 150 minutes each week. The exercise should increase your heart rate and make you sweat (moderate-intensity exercise).  Most adults should also do strengthening exercises at least twice a week. This is in addition to the moderate-intensity exercise.  Maintain a healthy weight  Body mass index (BMI) is a measurement that can be used to identify possible weight problems. It estimates body fat based on height and weight. Your health care provider can help determine your BMI and help you achieve or maintain a healthy weight.  For females 20 years of age and older:   A BMI below 18.5 is considered underweight.  A BMI of 18.5 to 24.9 is normal.  A BMI of 25 to 29.9 is considered overweight.  A BMI of 30 and above is considered obese.  Watch levels of cholesterol and blood lipids  You should  start having your blood tested for lipids and cholesterol at 22 years of age, then have this test every 5 years.  You may need to have your cholesterol levels checked more often if:  Your lipid or cholesterol levels are high.  You are older than 22 years of age.  You are at high risk for heart disease.  CANCER SCREENING   Lung Cancer  Lung cancer screening is recommended for adults 55-80 years old who are at high risk for lung cancer because of a history of smoking.  A yearly low-dose CT scan of the lungs is recommended for people who:  Currently smoke.  Have quit within the past 15 years.  Have at least a 30-pack-year history of smoking. A pack year is smoking an average of one pack of cigarettes a day for 1 year.  Yearly screening should continue until it has been 15 years since you quit.  Yearly screening should stop if you develop a health problem that would prevent you from having lung cancer treatment.  Breast Cancer  Practice breast self-awareness. This means understanding how your breasts normally appear and feel.  It also means doing regular breast self-exams. Let your health care provider know about any changes, no matter how small.  If you are in your 20s or 30s, you should have a clinical breast exam (CBE) by a health care provider every 1-3 years as part of a regular health exam.  If you   are 74 or older, have a CBE every year. Also consider having a breast X-ray (mammogram) every year.  If you have a family history of breast cancer, talk to your health care provider about genetic screening.  If you are at high risk for breast cancer, talk to your health care provider about having an MRI and a mammogram every year.  Breast cancer gene (BRCA) assessment is recommended for women who have family members with BRCA-related cancers. BRCA-related cancers include:  Breast.  Ovarian.  Tubal.  Peritoneal cancers.  Results of the assessment will determine the  need for genetic counseling and BRCA1 and BRCA2 testing. Cervical Cancer Your health care provider may recommend that you be screened regularly for cancer of the pelvic organs (ovaries, uterus, and vagina). This screening involves a pelvic examination, including checking for microscopic changes to the surface of your cervix (Pap test). You may be encouraged to have this screening done every 3 years, beginning at age 36.  For women ages 61-65, health care providers may recommend pelvic exams and Pap testing every 3 years, or they may recommend the Pap and pelvic exam, combined with testing for human papilloma virus (HPV), every 5 years. Some types of HPV increase your risk of cervical cancer. Testing for HPV may also be done on women of any age with unclear Pap test results.  Other health care providers may not recommend any screening for nonpregnant women who are considered low risk for pelvic cancer and who do not have symptoms. Ask your health care provider if a screening pelvic exam is right for you.  If you have had past treatment for cervical cancer or a condition that could lead to cancer, you need Pap tests and screening for cancer for at least 20 years after your treatment. If Pap tests have been discontinued, your risk factors (such as having a new sexual partner) need to be reassessed to determine if screening should resume. Some women have medical problems that increase the chance of getting cervical cancer. In these cases, your health care provider may recommend more frequent screening and Pap tests. Colorectal Cancer  This type of cancer can be detected and often prevented.  Routine colorectal cancer screening usually begins at 22 years of age and continues through 22 years of age.  Your health care provider may recommend screening at an earlier age if you have risk factors for colon cancer.  Your health care provider may also recommend using home test kits to check for hidden blood in  the stool.  A small camera at the end of a tube can be used to examine your colon directly (sigmoidoscopy or colonoscopy). This is done to check for the earliest forms of colorectal cancer.  Routine screening usually begins at age 31.  Direct examination of the colon should be repeated every 5-10 years through 22 years of age. However, you may need to be screened more often if early forms of precancerous polyps or small growths are found. Skin Cancer  Check your skin from head to toe regularly.  Tell your health care provider about any new moles or changes in moles, especially if there is a change in a mole's shape or color.  Also tell your health care provider if you have a mole that is larger than the size of a pencil eraser.  Always use sunscreen. Apply sunscreen liberally and repeatedly throughout the day.  Protect yourself by wearing long sleeves, pants, a wide-brimmed hat, and sunglasses whenever you are outside.  HEART DISEASE, DIABETES, AND HIGH BLOOD PRESSURE   High blood pressure causes heart disease and increases the risk of stroke. High blood pressure is more likely to develop in:  People who have blood pressure in the high end of the normal range (130-139/85-89 mm Hg).  People who are overweight or obese.  People who are African American.  If you are 68-78 years of age, have your blood pressure checked every 3-5 years. If you are 83 years of age or older, have your blood pressure checked every year. You should have your blood pressure measured twice--once when you are at a hospital or clinic, and once when you are not at a hospital or clinic. Record the average of the two measurements. To check your blood pressure when you are not at a hospital or clinic, you can use:  An automated blood pressure machine at a pharmacy.  A home blood pressure monitor.  If you are between 65 years and 37 years old, ask your health care provider if you should take aspirin to prevent  strokes.  Have regular diabetes screenings. This involves taking a blood sample to check your fasting blood sugar level.  If you are at a normal weight and have a low risk for diabetes, have this test once every three years after 22 years of age.  If you are overweight and have a high risk for diabetes, consider being tested at a younger age or more often. PREVENTING INFECTION  Hepatitis B  If you have a higher risk for hepatitis B, you should be screened for this virus. You are considered at high risk for hepatitis B if:  You were born in a country where hepatitis B is common. Ask your health care provider which countries are considered high risk.  Your parents were born in a high-risk country, and you have not been immunized against hepatitis B (hepatitis B vaccine).  You have HIV or AIDS.  You use needles to inject street drugs.  You live with someone who has hepatitis B.  You have had sex with someone who has hepatitis B.  You get hemodialysis treatment.  You take certain medicines for conditions, including cancer, organ transplantation, and autoimmune conditions. Hepatitis C  Blood testing is recommended for:  Everyone born from [birth year] through 1967.  Anyone with known risk factors for hepatitis C. Sexually transmitted infections (STIs)  You should be screened for sexually transmitted infections (STIs) including gonorrhea and chlamydia if:  You are sexually active and are younger than 22 years of age.  You are older than 22 years of age and your health care provider tells you that you are at risk for this type of infection.  Your sexual activity has changed since you were last screened and you are at an increased risk for chlamydia or gonorrhea. Ask your health care provider if you are at risk.  If you do not have HIV, but are at risk, it may be recommended that you take a prescription medicine daily to prevent HIV infection. This is called pre-exposure prophylaxis  (PrEP). You are considered at risk if:  You are sexually active and do not regularly use condoms or know the HIV status of your partner(s).  You take drugs by injection.  You are sexually active with a partner who has HIV. Talk with your health care provider about whether you are at high risk of being infected with HIV. If you choose to begin PrEP, you should first be tested for HIV. You  should then be tested every 3 months for as long as you are taking PrEP.  PREGNANCY   If you are premenopausal and you may become pregnant, ask your health care provider about preconception counseling.  If you may become pregnant, take 400 to 800 micrograms (mcg) of folic acid every day.  If you want to prevent pregnancy, talk to your health care provider about birth control (contraception). OSTEOPOROSIS AND MENOPAUSE   Osteoporosis is a disease in which the bones lose minerals and strength with aging. This can result in serious bone fractures. Your risk for osteoporosis can be identified using a bone density scan.  If you are 46 years of age or older, or if you are at risk for osteoporosis and fractures, ask your health care provider if you should be screened.  Ask your health care provider whether you should take a calcium or vitamin D supplement to lower your risk for osteoporosis.  Menopause may have certain physical symptoms and risks.  Hormone replacement therapy may reduce some of these symptoms and risks. Talk to your health care provider about whether hormone replacement therapy is right for you.  HOME CARE INSTRUCTIONS   Schedule regular health, dental, and eye exams.  Stay current with your immunizations.   Do not use any tobacco products including cigarettes, chewing tobacco, or electronic cigarettes.  If you are pregnant, do not drink alcohol.  If you are breastfeeding, limit how much and how often you drink alcohol.  Limit alcohol intake to no more than 1 drink per day for  nonpregnant women. One drink equals 12 ounces of beer, 5 ounces of wine, or 1 ounces of hard liquor.  Do not use street drugs.  Do not share needles.  Ask your health care provider for help if you need support or information about quitting drugs.  Tell your health care provider if you often feel depressed.  Tell your health care provider if you have ever been abused or do not feel safe at home.   This information is not intended to replace advice given to you by your health care provider. Make sure you discuss any questions you have with your health care provider.   Document Released: 10/31/2010 Document Revised: 05/08/2014 Document Reviewed: 03/19/2013 [employer] Interactive Patient Education [employer].

## 2017-01-19 ENCOUNTER — Encounter: Payer: Self-pay | Admitting: Internal Medicine
